# Patient Record
Sex: Male | Born: 1975 | Race: White | Hispanic: No | Smoking: Never smoker
Health system: Southern US, Community
[De-identification: ages and names within clinical notes are randomized; demographics above are authoritative.]

---

## 2016-03-22 ENCOUNTER — Emergency Department (HOSPITAL_COMMUNITY): Payer: Self-pay | Admitting: Anesthesiology

## 2016-03-22 ENCOUNTER — Emergency Department (HOSPITAL_COMMUNITY): Payer: Self-pay

## 2016-03-22 ENCOUNTER — Ambulatory Visit (HOSPITAL_COMMUNITY)
Admission: EM | Admit: 2016-03-22 | Discharge: 2016-03-23 | Disposition: A | Discharge status: Home / Self Care | Payer: Self-pay | Attending: Emergency Medicine | Admitting: Emergency Medicine

## 2016-03-22 ENCOUNTER — Encounter (HOSPITAL_COMMUNITY)
Admission: EM | Disposition: A | Discharge status: Home / Self Care | Payer: Self-pay | Source: Home / Self Care | Attending: Emergency Medicine

## 2016-03-22 ENCOUNTER — Encounter (HOSPITAL_COMMUNITY): Payer: Self-pay | Admitting: Emergency Medicine

## 2016-03-22 DIAGNOSIS — S67193A Crushing injury of left middle finger, initial encounter: Secondary | ICD-10-CM | POA: Insufficient documentation

## 2016-03-22 DIAGNOSIS — Y990 Civilian activity done for income or pay: Secondary | ICD-10-CM | POA: Insufficient documentation

## 2016-03-22 DIAGNOSIS — S6722XA Crushing injury of left hand, initial encounter: Secondary | ICD-10-CM | POA: Insufficient documentation

## 2016-03-22 DIAGNOSIS — S67191A Crushing injury of left index finger, initial encounter: Secondary | ICD-10-CM | POA: Insufficient documentation

## 2016-03-22 DIAGNOSIS — S66521A Laceration of intrinsic muscle, fascia and tendon of left index finger at wrist and hand level, initial encounter: Secondary | ICD-10-CM | POA: Insufficient documentation

## 2016-03-22 DIAGNOSIS — S61213A Laceration without foreign body of left middle finger without damage to nail, initial encounter: Secondary | ICD-10-CM | POA: Insufficient documentation

## 2016-03-22 DIAGNOSIS — Z23 Encounter for immunization: Secondary | ICD-10-CM | POA: Insufficient documentation

## 2016-03-22 DIAGNOSIS — W3189XA Contact with other specified machinery, initial encounter: Secondary | ICD-10-CM | POA: Insufficient documentation

## 2016-03-22 DIAGNOSIS — S61211A Laceration without foreign body of left index finger without damage to nail, initial encounter: Secondary | ICD-10-CM | POA: Insufficient documentation

## 2016-03-22 DIAGNOSIS — S61402A Unspecified open wound of left hand, initial encounter: Secondary | ICD-10-CM

## 2016-03-22 DIAGNOSIS — Y9263 Factory as the place of occurrence of the external cause: Secondary | ICD-10-CM | POA: Insufficient documentation

## 2016-03-22 HISTORY — PX: I & D EXTREMITY: SHX5045

## 2016-03-22 LAB — CBC WITH DIFFERENTIAL/PLATELET
BASOS PCT: 0 %
Basophils Absolute: 0 10*3/uL (ref 0.0–0.1)
EOS ABS: 0.1 10*3/uL (ref 0.0–0.7)
EOS PCT: 1 %
HCT: 41.5 % (ref 39.0–52.0)
HEMOGLOBIN: 13.9 g/dL (ref 13.0–17.0)
LYMPHS ABS: 2.6 10*3/uL (ref 0.7–4.0)
Lymphocytes Relative: 18 %
MCH: 31.2 pg (ref 26.0–34.0)
MCHC: 33.5 g/dL (ref 30.0–36.0)
MCV: 93 fL (ref 78.0–100.0)
MONO ABS: 1 10*3/uL (ref 0.1–1.0)
MONOS PCT: 7 %
Neutro Abs: 10.3 10*3/uL — ABNORMAL HIGH (ref 1.7–7.7)
Neutrophils Relative %: 74 %
Platelets: 236 10*3/uL (ref 150–400)
RBC: 4.46 MIL/uL (ref 4.22–5.81)
RDW: 12.6 % (ref 11.5–15.5)
WBC: 14 10*3/uL — ABNORMAL HIGH (ref 4.0–10.5)

## 2016-03-22 LAB — BASIC METABOLIC PANEL
Anion gap: 9 (ref 5–15)
BUN: 10 mg/dL (ref 6–20)
CALCIUM: 8.4 mg/dL — AB (ref 8.9–10.3)
CHLORIDE: 108 mmol/L (ref 101–111)
CO2: 23 mmol/L (ref 22–32)
CREATININE: 1 mg/dL (ref 0.61–1.24)
GFR calc Af Amer: >60 mL/min (ref >60)
GFR calc non Af Amer: >60 mL/min (ref >60)
Glucose, Bld: 105 mg/dL — ABNORMAL HIGH (ref 65–99)
Potassium: 3.8 mmol/L (ref 3.5–5.1)
SODIUM: 140 mmol/L (ref 135–145)

## 2016-03-22 SURGERY — IRRIGATION AND DEBRIDEMENT EXTREMITY
Anesthesia: General | Site: Arm Lower | Laterality: Left

## 2016-03-22 MED ORDER — CEFAZOLIN SODIUM 1-5 GM-% IV SOLN
1.0000 g | Freq: Once | INTRAVENOUS | Status: AC
  Administered 2016-03-22: 1 g via INTRAVENOUS
  Filled 2016-03-22: qty 50

## 2016-03-22 MED ORDER — LACTATED RINGERS IV SOLN
INTRAVENOUS | Status: DC | PRN
  Administered 2016-03-22 (×2): via INTRAVENOUS

## 2016-03-22 MED ORDER — LIDOCAINE HCL (CARDIAC) 20 MG/ML IV SOLN
INTRAVENOUS | Status: DC | PRN
  Administered 2016-03-22: 75 mg via INTRAVENOUS

## 2016-03-22 MED ORDER — PROPOFOL 10 MG/ML IV BOLUS
INTRAVENOUS | Status: AC
  Filled 2016-03-22: qty 40

## 2016-03-22 MED ORDER — HYDROMORPHONE HCL 1 MG/ML IJ SOLN
INTRAMUSCULAR | Status: AC
  Filled 2016-03-22: qty 1

## 2016-03-22 MED ORDER — ONDANSETRON HCL 4 MG/2ML IJ SOLN
4.0000 mg | Freq: Once | INTRAMUSCULAR | Status: AC
  Administered 2016-03-22: 4 mg via INTRAVENOUS

## 2016-03-22 MED ORDER — BUPIVACAINE HCL (PF) 0.25 % IJ SOLN
INTRAMUSCULAR | Status: AC
  Filled 2016-03-22: qty 30

## 2016-03-22 MED ORDER — SODIUM CHLORIDE 0.9 % IR SOLN
Status: DC | PRN
  Administered 2016-03-22: 3000 mL

## 2016-03-22 MED ORDER — CEFAZOLIN SODIUM 1-5 GM-% IV SOLN
INTRAVENOUS | Status: DC | PRN
  Administered 2016-03-22: 1 g via INTRAVENOUS

## 2016-03-22 MED ORDER — SUFENTANIL CITRATE 50 MCG/ML IV SOLN
INTRAVENOUS | Status: DC | PRN
  Administered 2016-03-22 (×3): 10 ug via INTRAVENOUS
  Administered 2016-03-22: 5 ug via INTRAVENOUS
  Administered 2016-03-22: 10 ug via INTRAVENOUS
  Administered 2016-03-22: 5 ug via INTRAVENOUS
  Administered 2016-03-22 (×2): 10 ug via INTRAVENOUS

## 2016-03-22 MED ORDER — ONDANSETRON HCL 4 MG/2ML IJ SOLN
INTRAMUSCULAR | Status: DC | PRN
  Administered 2016-03-22: 4 mg via INTRAVENOUS

## 2016-03-22 MED ORDER — SUFENTANIL CITRATE 50 MCG/ML IV SOLN
INTRAVENOUS | Status: AC
  Filled 2016-03-22: qty 1

## 2016-03-22 MED ORDER — SODIUM CHLORIDE 0.9 % IJ SOLN
INTRAMUSCULAR | Status: AC
  Filled 2016-03-22: qty 10

## 2016-03-22 MED ORDER — MIDAZOLAM HCL 2 MG/2ML IJ SOLN
INTRAMUSCULAR | Status: AC
  Filled 2016-03-22: qty 2

## 2016-03-22 MED ORDER — TETANUS-DIPHTH-ACELL PERTUSSIS 5-2.5-18.5 LF-MCG/0.5 IM SUSP
INTRAMUSCULAR | Status: AC
  Filled 2016-03-22: qty 0.5

## 2016-03-22 MED ORDER — PROPOFOL 10 MG/ML IV BOLUS
INTRAVENOUS | Status: DC | PRN
  Administered 2016-03-22: 160 mg via INTRAVENOUS

## 2016-03-22 MED ORDER — HYDROMORPHONE HCL 1 MG/ML IJ SOLN
1.0000 mg | Freq: Once | INTRAMUSCULAR | Status: AC
  Administered 2016-03-22: 1 mg via INTRAVENOUS

## 2016-03-22 MED ORDER — TETANUS-DIPHTH-ACELL PERTUSSIS 5-2.5-18.5 LF-MCG/0.5 IM SUSP
0.5000 mL | Freq: Once | INTRAMUSCULAR | Status: AC
  Administered 2016-03-22: 0.5 mL via INTRAMUSCULAR

## 2016-03-22 MED ORDER — ROCURONIUM BROMIDE 50 MG/5ML IV SOLN
INTRAVENOUS | Status: AC
  Filled 2016-03-22: qty 1

## 2016-03-22 MED ORDER — MIDAZOLAM HCL 5 MG/5ML IJ SOLN
INTRAMUSCULAR | Status: DC | PRN
  Administered 2016-03-22: 2 mg via INTRAVENOUS

## 2016-03-22 MED ORDER — HYDROMORPHONE HCL 1 MG/ML IJ SOLN
2.0000 mg | Freq: Once | INTRAMUSCULAR | Status: AC
  Administered 2016-03-22: 2 mg via INTRAVENOUS

## 2016-03-22 MED ORDER — OXYCODONE-ACETAMINOPHEN 5-325 MG PO TABS: ORAL_TABLET | ORAL | Status: AC

## 2016-03-22 MED ORDER — 0.9 % SODIUM CHLORIDE (POUR BTL) OPTIME
TOPICAL | Status: DC | PRN
  Administered 2016-03-22: 1000 mL

## 2016-03-22 MED ORDER — SULFAMETHOXAZOLE-TRIMETHOPRIM 800-160 MG PO TABS: 1.0000 | ORAL_TABLET | Freq: Two times a day (BID) | ORAL | Status: AC

## 2016-03-22 MED ORDER — SUCCINYLCHOLINE CHLORIDE 20 MG/ML IJ SOLN
INTRAMUSCULAR | Status: DC | PRN
  Administered 2016-03-22: 120 mg via INTRAVENOUS

## 2016-03-22 MED ORDER — LIDOCAINE HCL (CARDIAC) 20 MG/ML IV SOLN
INTRAVENOUS | Status: AC
  Filled 2016-03-22: qty 5

## 2016-03-22 MED ORDER — ONDANSETRON HCL 4 MG/2ML IJ SOLN
INTRAMUSCULAR | Status: AC
  Filled 2016-03-22: qty 2

## 2016-03-22 MED ORDER — HYDROMORPHONE HCL 1 MG/ML IJ SOLN
INTRAMUSCULAR | Status: AC
  Filled 2016-03-22: qty 2

## 2016-03-22 MED ORDER — CEFAZOLIN SODIUM 1 G IJ SOLR
INTRAMUSCULAR | Status: AC
  Filled 2016-03-22: qty 10

## 2016-03-22 MED ORDER — BUPIVACAINE HCL (PF) 0.25 % IJ SOLN
INTRAMUSCULAR | Status: DC | PRN
  Administered 2016-03-22: 10 mL

## 2016-03-22 SURGICAL SUPPLY — 66 items
BANDAGE ACE 3X5.8 VEL STRL LF (GAUZE/BANDAGES/DRESSINGS) ×4 IMPLANT
BANDAGE ELASTIC 3 VELCRO ST LF (GAUZE/BANDAGES/DRESSINGS) ×4 IMPLANT
BANDAGE ELASTIC 4 VELCRO ST LF (GAUZE/BANDAGES/DRESSINGS) ×4 IMPLANT
BNDG ESMARK 4X9 LF (GAUZE/BANDAGES/DRESSINGS) ×4 IMPLANT
BNDG GAUZE ELAST 4 BULKY (GAUZE/BANDAGES/DRESSINGS) ×4 IMPLANT
CHLORAPREP W/TINT 26ML (MISCELLANEOUS) IMPLANT
CORDS BIPOLAR (ELECTRODE) ×4 IMPLANT
COVER MAYO STAND STRL (DRAPES) IMPLANT
COVER SURGICAL LIGHT HANDLE (MISCELLANEOUS) ×4 IMPLANT
COVER TABLE BACK 60X90 (DRAPES) IMPLANT
CUFF TOURNIQUET SINGLE 18IN (TOURNIQUET CUFF) ×4 IMPLANT
DRAIN PENROSE 1/4X12 LTX STRL (WOUND CARE) IMPLANT
DRAPE EXTREMITY T 121X128X90 (DRAPE) IMPLANT
DRAPE SURG 17X23 STRL (DRAPES) IMPLANT
DRSG ADAPTIC 3X8 NADH LF (GAUZE/BANDAGES/DRESSINGS) IMPLANT
DRSG EMULSION OIL 3X3 NADH (GAUZE/BANDAGES/DRESSINGS) ×4 IMPLANT
GAUZE SPONGE 4X4 12PLY STRL (GAUZE/BANDAGES/DRESSINGS) ×4 IMPLANT
GAUZE XEROFORM 1X8 LF (GAUZE/BANDAGES/DRESSINGS) ×4 IMPLANT
GLOVE BIO SURGEON STRL SZ7.5 (GLOVE) ×8 IMPLANT
GLOVE BIOGEL PI IND STRL 8 (GLOVE) ×4 IMPLANT
GLOVE BIOGEL PI INDICATOR 8 (GLOVE) ×4
GOWN STRL REUS W/ TWL LRG LVL3 (GOWN DISPOSABLE) ×2 IMPLANT
GOWN STRL REUS W/ TWL XL LVL3 (GOWN DISPOSABLE) ×2 IMPLANT
GOWN STRL REUS W/TWL LRG LVL3 (GOWN DISPOSABLE) ×2
GOWN STRL REUS W/TWL XL LVL3 (GOWN DISPOSABLE) ×2
KIT BASIN OR (CUSTOM PROCEDURE TRAY) ×4 IMPLANT
KIT ROOM TURNOVER OR (KITS) ×4 IMPLANT
MANIFOLD NEPTUNE II (INSTRUMENTS) ×4 IMPLANT
NDL SAFETY ECLIPSE 18X1.5 (NEEDLE) IMPLANT
NEEDLE HYPO 18GX1.5 SHARP (NEEDLE)
NEEDLE HYPO 25X1 1.5 SAFETY (NEEDLE) IMPLANT
NS IRRIG 1000ML POUR BTL (IV SOLUTION) ×4 IMPLANT
PACK ORTHO EXTREMITY (CUSTOM PROCEDURE TRAY) ×4 IMPLANT
PAD ARMBOARD 7.5X6 YLW CONV (MISCELLANEOUS) ×4 IMPLANT
PAD CAST 3X4 CTTN HI CHSV (CAST SUPPLIES) ×2 IMPLANT
PAD CAST 4YDX4 CTTN HI CHSV (CAST SUPPLIES) ×2 IMPLANT
PADDING CAST ABS 4INX4YD NS (CAST SUPPLIES) ×2
PADDING CAST ABS COTTON 4X4 ST (CAST SUPPLIES) ×2 IMPLANT
PADDING CAST COTTON 3X4 STRL (CAST SUPPLIES) ×2
PADDING CAST COTTON 4X4 STRL (CAST SUPPLIES) ×2
SCRUB BETADINE 4OZ XXX (MISCELLANEOUS) ×4 IMPLANT
SOLUTION BETADINE 4OZ (MISCELLANEOUS) ×4 IMPLANT
SPLINT PLASTER CAST XFAST 3X15 (CAST SUPPLIES) ×2 IMPLANT
SPLINT PLASTER EXTRA FAST 3X15 (CAST SUPPLIES) ×2
SPLINT PLASTER GYPS XFAST 3X15 (CAST SUPPLIES) ×2 IMPLANT
SPLINT PLASTER XTRA FASTSET 3X (CAST SUPPLIES) ×2
SPONGE LAP 4X18 X RAY DECT (DISPOSABLE) IMPLANT
STOCKINETTE 4X48 STRL (DRAPES) IMPLANT
SUT ETHIBOND 3-0 V-5 (SUTURE) IMPLANT
SUT ETHILON 3 0 PS 1 (SUTURE) ×16 IMPLANT
SUT ETHILON 4 0 P 3 18 (SUTURE) IMPLANT
SUT ETHILON 4 0 PS 2 18 (SUTURE) ×12 IMPLANT
SUT FIBERWIRE 4-0 18 TAPR NDL (SUTURE)
SUT MERSILENE 6 0 P 1 (SUTURE) IMPLANT
SUT MON AB 5-0 P3 18 (SUTURE) IMPLANT
SUT NYLON 9 0 VRM6 (SUTURE) IMPLANT
SUT PROLENE 6 0 P 1 18 (SUTURE) IMPLANT
SUT SILK 4 0 PS 2 (SUTURE) IMPLANT
SUT VICRYL 4-0 PS2 18IN ABS (SUTURE) IMPLANT
SUTURE FIBERWR 4-0 18 TAPR NDL (SUTURE) IMPLANT
SYR CONTROL 10ML LL (SYRINGE) IMPLANT
TOWEL OR 17X24 6PK STRL BLUE (TOWEL DISPOSABLE) ×8 IMPLANT
TOWEL OR 17X26 10 PK STRL BLUE (TOWEL DISPOSABLE) ×4 IMPLANT
TUBING CYSTO DISP (UROLOGICAL SUPPLIES) ×4 IMPLANT
UNDERPAD 30X30 INCONTINENT (UNDERPADS AND DIAPERS) ×4 IMPLANT
YANKAUER SUCT BULB TIP NO VENT (SUCTIONS) ×4 IMPLANT

## 2016-03-22 NOTE — Anesthesia Preprocedure Evaluation (Addendum)
Anesthesia Evaluation   Patient awake    Reviewed: Allergy & Precautions, NPO status , Patient's Chart, lab work & pertinent test results  History of Anesthesia Complications Negative for: history of anesthetic complications  Airway Mallampati: II   Neck ROM: Full    Dental  (+) Teeth Intact, Dental Advisory Given   Pulmonary Current Smoker,  Smokes marijuana daily   breath sounds clear to auscultation       Cardiovascular negative cardio ROS   Rhythm:Regular Rate:Normal     Neuro/Psych negative neurological ROS  negative psych ROS   GI/Hepatic negative GI ROS, (+)     substance abuse  alcohol use and marijuana use,   Endo/Other  negative endocrine ROS  Renal/GU Renal disease     Musculoskeletal negative musculoskeletal ROS (+)   Abdominal   Peds  Hematology negative hematology ROS (+)   Anesthesia Other Findings   Reproductive/Obstetrics                           Anesthesia Physical Anesthesia Plan  ASA: II and emergent  Anesthesia Plan: General   Post-op Pain Management:    Induction: Intravenous  Airway Management Planned: Oral ETT  Additional Equipment:   Intra-op Plan:   Post-operative Plan: Extubation in OR  Informed Consent:   Dental advisory given  Plan Discussed with: CRNA, Anesthesiologist and Surgeon  Anesthesia Plan Comments:         Anesthesia Quick Evaluation

## 2016-03-22 NOTE — Anesthesia Postprocedure Evaluation (Signed)
Anesthesia Post Note  Patient: Edward Stewart  Procedure(s) Performed: Procedure(s) (LRB): EXPLORATION OF  PALMAR/DORSAL WOUND, REPAIR INTRINSIC MUSCLE LEFT INDEX FINGER. (Left)  Patient location during evaluation: PACU Anesthesia Type: General Level of consciousness: awake Vital Signs Assessment: post-procedure vital signs reviewed and stable Respiratory status: spontaneous breathing Cardiovascular status: stable    Last Vitals:  Filed Vitals:   03/22/16 1935 03/22/16 2112  BP: 146/104 147/84  Pulse: 75 72  Temp: 36.9 C   Resp: 26 17    Last Pain:  Filed Vitals:   03/22/16 2113  PainSc: 10-Worst pain ever                 EDWARDS,Carl Butner

## 2016-03-22 NOTE — Discharge Instructions (Signed)

## 2016-03-22 NOTE — Brief Op Note (Signed)
03/22/2016  11:52 PM  PATIENT:  Renie OraMatthew Dowding  40 y.o. male  PRE-OPERATIVE DIAGNOSIS:  LEFT HAND LACERATIONS AND PARTIAL DEGLOVING  POST-OPERATIVE DIAGNOSIS:  LEFT HAND LACERATIONS AND PARTIAL DEGLOVING  PROCEDURE:  Procedure(s): EXPLORATION OF  PALMAR/DORSAL WOUND, REPAIR INTRINSIC MUSCLE LEFT INDEX FINGER. (Left)  SURGEON:  Surgeon(s) and Role:    * Betha LoaKevin Mehdi Gironda, MD - Primary  PHYSICIAN ASSISTANT:   ASSISTANTS: none   ANESTHESIA:   general  EBL:  Total I/O In: 1500 [I.V.:1500] Out: 75 [Blood:75]  BLOOD ADMINISTERED:none  DRAINS: none   LOCAL MEDICATIONS USED:  MARCAINE     SPECIMEN:  No Specimen  DISPOSITION OF SPECIMEN:  N/A  COUNTS:  YES  TOURNIQUET:  * Missing tourniquet times found for documented tourniquets in log:  [id number] *  DICTATION: .Other Dictation: Dictation Number [id number]  PLAN OF CARE: Discharge to home after PACU  PATIENT DISPOSITION:  PACU - hemodynamically stable.

## 2016-03-22 NOTE — ED Provider Notes (Signed)
CSN: [id number]     Arrival date & time 03/22/16  1913 History   First MD Initiated Contact with Patient 03/22/16 1917     Chief Complaint  Patient presents with  . Hand Injury     (Consider location/radiation/quality/duration/timing/severity/associated sxs/prior Treatment) HPI patient is otherwise healthy male who presents following a hand injury. 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 His left hand became trapped, and when he pulled it out he suffered a degloving of the dorsal tissue of his hand. He is complaining of severe pain, and received 175 g of fentanyl from EMS prior to arrival. He is not up-to-date on his tetanus. He has been able to move his fingers, and denies any numbness in his hand.    History reviewed. No pertinent past medical history. History reviewed. No pertinent past surgical history. History reviewed. No pertinent family history. Social History  Substance Use Topics  . Smoking status: Never Smoker   . Smokeless tobacco: None  . Alcohol Use: No    Review of Systems  Musculoskeletal: Positive for arthralgias.  Skin: Positive for wound.  All other systems reviewed and are negative.     Allergies  Review of patient's allergies indicates no known allergies.  Home Medications   Prior to Admission medications   Medication Sig Start Date End Date Taking? Authorizing Provider  oxyCODONE-acetaminophen (PERCOCET) 5-325 MG tablet 1-2 tabs po q6 hours prn pain 03/22/16   Betha LoaKevin Kuzma, MD  sulfamethoxazole-trimethoprim (BACTRIM DS) 800-160 MG tablet Take 1 tablet by mouth 2 (two) times daily. 03/22/16   Betha LoaKevin Kuzma, MD   BP 127/88 mmHg  Pulse 87  Temp(Src) 97.7 F (36.5 C) (Oral)  Resp 13  SpO2 93% Physical Exam  Constitutional: He is oriented to person, place, and time. He appears well-developed and well-nourished.  HENT:  Head: Normocephalic and atraumatic.  Eyes: Pupils are equal,  round, and reactive to light.  Neck: Normal range of motion. No JVD present.  Cardiovascular: Normal rate.   Pulmonary/Chest: Effort normal and breath sounds normal.  Abdominal: Soft. Bowel sounds are normal. He exhibits distension.  Musculoskeletal:  Extensive degloving injury to the volar surface of the left hand. Visible retinaculum, extensor tendons. Able to flex and extend all fingers against resistance. Fingers appear well perfused. Mild sensory deficits over the volar skin of the left hand, but sensation and preserved in fingertips.  Neurological: He is alert and oriented to person, place, and time.  Skin: Skin is warm and dry.  Nursing note and vitals reviewed.   ED Course  Procedures (including critical care time) Labs Review Labs Reviewed  CBC WITH DIFFERENTIAL/PLATELET - Abnormal; Notable for the following:    WBC 14.0 (*)    Neutro Abs 10.3 (*)    All other components within normal limits  BASIC METABOLIC PANEL - Abnormal; Notable for the following:    Glucose, Bld 105 (*)    Calcium 8.4 (*)    All other components within normal limits    Imaging Review Dg Hand Complete Left  03/22/2016  CLINICAL DATA:  Left hand caught in conveyer belt, with crush injury to fingers. Initial encounter. EXAM: LEFT HAND - COMPLETE 3+ VIEW COMPARISON:  None. FINDINGS: There is no evidence of fracture or dislocation. The soft tissues are difficult to fully assess due to the overlying dressing, though there appears to be scattered soft tissue air about the hand, with diffuse soft tissue  swelling. Visualized joint spaces are grossly preserved. IMPRESSION: 1. No evidence of fracture or dislocation. 2. Scattered soft tissue air about the hand, with diffuse soft tissue swelling. Electronically Signed   By: Roanna Raider M.D.   On: 03/22/2016 20:12   I have personally reviewed and evaluated these images and lab results as part of my medical decision-making.   EKG Interpretation None      MDM    Final diagnoses:  Degloving injury of left hand, initial encounter    Patient presents with degloving injury to his left hand. X-rays obtained and do not show any evidence of fracture. He does have mild paresthesias over the skin where it appears his cutaneous nerves been disrupted, however grossly he has intact motor function in his hand. He was given IV antibiotics initially upon his arrival and his pain was controlled. Consultation was obtained from the hand surgeon who plans for emergent operative management.    Erskine Emery, MD 03/23/16 1610  Richardean Canal, MD 03/25/16 [phone]

## 2016-03-22 NOTE — Anesthesia Procedure Notes (Signed)
Procedure Name: Intubation Date/Time: 03/22/2016 9:54 PM Performed by: Nicholos JohnsMCPHAIL, Jaysa Kise S Pre-anesthesia Checklist: Patient identified, Emergency Drugs available, Suction available, Patient being monitored and Timeout performed Patient Re-evaluated:Patient Re-evaluated prior to inductionOxygen Delivery Method: Circle system utilized Preoxygenation: Pre-oxygenation with 100% oxygen Intubation Type: IV induction, Rapid sequence and Cricoid Pressure applied Laryngoscope Size: Mac and 4 Grade View: Grade I Tube type: Oral Tube size: 8.0 mm Number of attempts: 1 Airway Equipment and Method: Bougie stylet Placement Confirmation: ETT inserted through vocal cords under direct vision,  positive ETCO2 and breath sounds checked- equal and bilateral Secured at: 23 cm Tube secured with: Tape Dental Injury: Teeth and Oropharynx as per pre-operative assessment

## 2016-03-22 NOTE — ED Notes (Signed)
Soaked two abdominal pads with sterile saline 0.9% and wrapped hand, also used two dry abdominal pads to cover soaked pads, wrapped entire hand with 4 inch gauze fluff roll, per MD.

## 2016-03-22 NOTE — H&P (Signed)
Edward Stewart is an 40 y.o. male.   Chief Complaint: left hand lacerations/degloving HPI: 40 year old right-hand dominant male states he was at work when he reached onto a conveyer belt which caught his left hand pulling it into the belt. He sustained lacerations and wounds to both the volar and dorsal aspects of the hand. He was brought to the [facility] emergency department where he was evaluated. I was consulted for management of the injury. He reports no previous injuries to the hand and no other injuries at this time. He reports the injury happened in approximate 6:00. He is complaining of significant pain and requesting pain medication. He notes no numbness in the hand. No fevers chills or night sweats.  Case discussed with Dr. Darl Householder and the note from Leata Mouse, MD on 03/22/16 was reviewed. Xrays viewed and interpreted by me: AP, lateral, oblique views of left hand show no fractures, dislocations, radioopaque foreign bodies. Labs reviewed: WBC 14.0  Allergies: No Known Allergies  History reviewed. No pertinent past medical history.  History reviewed. No pertinent past surgical history.  Family History: History reviewed. No pertinent family history.  Social History:   reports that he has never smoked. He does not have any smokeless tobacco history on file. He reports that he does not drink alcohol or use illicit drugs.  Medications:  (Not in a hospital admission)  Results for orders placed or performed during the hospital encounter of 03/22/16 (from the past 48 hour(s))  CBC with Differential     Status: Abnormal   Collection Time: 03/22/16  7:45 PM  Result Value Ref Range   WBC 14.0 (H) 4.0 - 10.5 K/uL   RBC 4.46 4.22 - 5.81 MIL/uL   Hemoglobin 13.9 13.0 - 17.0 g/dL   HCT 41.5 39.0 - 52.0 %   MCV 93.0 78.0 - 100.0 fL   MCH 31.2 26.0 - 34.0 pg   MCHC 33.5 30.0 - 36.0 g/dL   RDW 12.6 11.5 - 15.5 %   Platelets 236 150 - 400 K/uL   Neutrophils Relative % 74 %   Neutro Abs 10.3  (H) 1.7 - 7.7 K/uL   Lymphocytes Relative 18 %   Lymphs Abs 2.6 0.7 - 4.0 K/uL   Monocytes Relative 7 %   Monocytes Absolute 1.0 0.1 - 1.0 K/uL   Eosinophils Relative 1 %   Eosinophils Absolute 0.1 0.0 - 0.7 K/uL   Basophils Relative 0 %   Basophils Absolute 0.0 0.0 - 0.1 K/uL  Basic metabolic panel     Status: Abnormal   Collection Time: 03/22/16  7:45 PM  Result Value Ref Range   Sodium 140 135 - 145 mmol/L   Potassium 3.8 3.5 - 5.1 mmol/L   Chloride 108 101 - 111 mmol/L   CO2 23 22 - 32 mmol/L   Glucose, Bld 105 (H) 65 - 99 mg/dL   BUN 10 6 - 20 mg/dL   Creatinine, Ser 1.00 0.61 - 1.24 mg/dL   Calcium 8.4 (L) 8.9 - 10.3 mg/dL   GFR calc non Af Amer >60 >60 mL/min   GFR calc Af Amer >60 >60 mL/min    Comment: (NOTE) The eGFR has been calculated using the CKD EPI equation. This calculation has not been validated in all clinical situations. eGFR's persistently <60 mL/min signify possible Chronic Kidney Disease.    Anion gap 9 5 - 15    Dg Hand Complete Left  03/22/2016  CLINICAL DATA:  Left hand caught in conveyer belt, with  crush injury to fingers. Initial encounter. EXAM: LEFT HAND - COMPLETE 3+ VIEW COMPARISON:  None. FINDINGS: There is no evidence of fracture or dislocation. The soft tissues are difficult to fully assess due to the overlying dressing, though there appears to be scattered soft tissue air about the hand, with diffuse soft tissue swelling. Visualized joint spaces are grossly preserved. IMPRESSION: 1. No evidence of fracture or dislocation. 2. Scattered soft tissue air about the hand, with diffuse soft tissue swelling. Electronically Signed   By: Garald Balding M.D.   On: 03/22/2016 20:12     A comprehensive review of systems was negative. Review of Systems: No fevers, chills, night sweats, chest pain, shortness of breath, nausea, vomiting, diarrhea, constipation, easy bleeding or bruising, headaches, dizziness, vision changes, fainting.   Blood pressure  146/104, pulse 75, temperature 98.5 F (36.9 C), temperature source Oral, resp. rate 26, SpO2 95 %.  General appearance: alert, cooperative, appears stated age and moderate distress Head: Normocephalic, without obvious abnormality, atraumatic Neck: supple, symmetrical, trachea midline Resp: clear to auscultation bilaterally Cardio: regular rate and rhythm GI: non-tender Extremities: right ue: no wounds or ttp.  Intact epl/fpl/io.  Intact sensation and capillary refill all digits.  Left UE: intact sensation and capillary refill all digits, though significant blood on fingertips.  States fingers all feel normal.  +epl/fpl/io.  Intact fdp/fds to digits.  Able to extend limited by pain.  Dorsoulnar laceration with partial distal degloving and wound in thumb index webspace volarly.  Decreased sensation on dorsoulnar side of hand but intact dorsum of ring/small finger.  Intact sensation dorsoradial hand and digits. Pulses: 2+ and symmetric Skin: Skin color, texture, turgor normal. No rashes or lesions or except as above Neurologic: Grossly normal Incision/Wound: As above  Assessment/Plan Left hand lacerations and partial degloving.  Recommend OR for irrigation and debridement and repair of tendon/arter/nerve as necessary.  Risks, benefits, and alternatives of surgery were discussed and the patient agrees with the plan of care.   Love Chowning R 03/22/2016, 9:13 PM

## 2016-03-22 NOTE — ED Notes (Signed)
Patient at work and saw a piece of plastic on conveyor belt, put hand in to retreive the plastic, got his hand caught in belt. He jerked his hand out, degloved the top of left hand.  Tendons and bone exposed.  Patient also has laceration to inner hand.  CSMT's and pulses intact.  Patient is CAOx4.

## 2016-03-22 NOTE — Op Note (Signed)
[id number] 

## 2016-03-23 ENCOUNTER — Encounter (HOSPITAL_COMMUNITY): Payer: Self-pay | Admitting: Orthopedic Surgery

## 2016-03-23 NOTE — Op Note (Addendum)
NAMESALLY, REIMERS NO.:  [id number]  MEDICAL RECORD NO.:  [id number]  LOCATION:  [facility]                         FACILITY:  [facility]  PHYSICIAN:  Betha Loa, MD        DATE OF BIRTH:  1976-10-19  DATE OF PROCEDURE:  03/22/2016 DATE OF DISCHARGE:                              OPERATIVE REPORT   PREOPERATIVE DIAGNOSES:  Left hand lacerations and partial degloving.  POSTOPERATIVE DIAGNOSES:  Left hand volar and dorsal lacerations and long finger laceration with dorsal being a partial degloving and intrinsic muscle to index finger laceration.  PROCEDURE:   1. Left hand exploration of 10 cm volar wound with repair of index finger intrinsic muscle 2. Left hand exploration of 18 cm dorsal degloving injury and repair of complex wound 3. Exploration and repair of left long finger 2 cm intermediate traumatic wound.  SURGEON:  Betha Loa, MD.  ASSISTANT:  None.  ANESTHESIA:  General.  IV FLUIDS:  Per anesthesia flow sheet.  ESTIMATED BLOOD LOSS:  Minimal.  COMPLICATIONS:  None.  SPECIMENS:  None.  TOURNIQUET TIME:  Approximately 60 minutes.  DISPOSITION:  Stable to PACU.  INDICATIONS:  Mr. Saadeh is a 40 year old right-hand-dominant male, who states while at work he reached up to get something off a conveyor belt when his hand was caught in the belt.  He sustained lacerations to the volar and dorsal sides of the hand.  He was brought to the emergency department where he was evaluated.  Radiographs were taken revealing no fractures.  I recommended operative exploration of the wound with repair of tendon, artery, and nerve as necessary.  Risks, benefits, and alternatives of surgery were discussed including risk of blood loss; infection; damage to nerves, vessels, tendons, ligaments, bone; failure of surgery; need for additional surgery; complications with wound healing; continued pain.  He voiced understanding of these risks and elected to  proceed.  OPERATIVE COURSE:  After being identified preoperatively by myself, the patient and I agreed upon procedure and site of procedure.  Surgical site was marked.  The risks, benefits, and alternatives of surgery were reviewed and wished to proceed.  Surgical consent had been signed.  He was given IV Ancef as preoperative antibiotic prophylaxis.  His tetanus had been updated by the Emergency Department.  He was transferred to the operating room and placed on the operating table in supine position with left upper extremity on arm board.  General anesthesia was induced by Anesthesiology.  Left upper extremity was prepped and draped in normal sterile orthopedic fashion.  Surgical pause was performed between surgeons, Anesthesia, and operating staff, and all were in agreement as to the patient, procedure, and site of procedure.  The wounds were explored.  The wound of the long finger was explored.  It was less than 2 cm in length.  The digital nerve and artery were noted to be intact.  The flexor sheath was intact.  The wound was repaired after irrigation and debridement.  There was a small pulsatile bleeding just under the skin of the palmar wound, which was treated with bipolar electrocautery.  The wound was 10 cm in length.  The palmar arch was identified.  It was an incomplete arch.  The arch terminated in the common digital artery to the index and long finger. The radial side of the index finger appeared to be supplied by a diminutive branch from the princeps pollicis.  There was an arterial branch going from the arch up toward the thumb in the more proximal portion of the laceration.  The radial digital nerve to the index finger was intact.  The common digital nerve to the index and long finger was identified and was intact.  There was some tearing of the palmar fascia which was excised.  There was a tear to the adductor muscle to the index finger and the thumb index web space.  This  was later repaired with a 2- 0 Vicryl suture in a figure-of-eight fashion.  This was reapproximated the muscle ends well.  The dorsal wound was explored.  This was a degloving type injury with open wound across the entire dorsum of the hand.  It was 18 cm in length.  There was a significant stretch to the dorsal branch of the ulnar nerve, but the nerve was intact.  There was some torn superficial veins and some of that remained intact.  All extensor tendons appeared to be intact.  The wounds were all copiously irrigated with sterile saline by cysto tubing. 3000 mL was used.  The wounds were then closed with 3-0 nylon in the proximal and dorsal surface and 4-0 nylon in the palm.  The index finger intrinsic muscle had been repaired with the 2-0 Vicryl suture.  Good approximation of wounds was obtained.  The tourniquet had been inflated part way through the case after exsanguination of the limb with Esmarch bandage.  It was deflated at the end of the case at approximately 60 minutes.  All fingertips were pink with brisk capillary refill after deflation of the tourniquet.  The wounds were dressed with sterile Xeroform, 4x4s, and wrapped with Kerlix.  A thumb spica splint and resting hand splint was placed and wrapped with Kerlix and Ace bandage. Tourniquet had been deflated.  The operative drapes were broken down. The patient was awoken from anesthesia safely.  He was transferred back to the stretcher and taken to PACU in stable condition.  I will see him back in the office in 1 week for postoperative followup.  I gave him Percocet 5/325, 1-2 p.o. q.6 hours p.r.n. pain, dispensed #30.  We also gave him Bactrim DS 1 p.o. b.i.d. x7 days.     Betha LoaKevin Acen Craun, MD     KK/MEDQ  D:  03/22/2016  T:  03/23/2016  Job:  [id number]  Addendum (04/02/16): Added repair of left long finger wound.  Added wound lengths and description to body of note.

## 2016-03-23 NOTE — Transfer of Care (Signed)
Immediate Anesthesia Transfer of Care Note  Patient: Edward Stewart  Procedure(s) Performed: Procedure(s): EXPLORATION OF  PALMAR/DORSAL WOUND, REPAIR INTRINSIC MUSCLE LEFT INDEX FINGER. (Left)  Patient Location: PACU  Anesthesia Type:General  Level of Consciousness: awake, alert  and oriented  Airway & Oxygen Therapy: Patient Spontanous Breathing and Patient connected to face mask oxygen  Post-op Assessment: Report given to RN and Post -op Vital signs reviewed and stable  Post vital signs: Reviewed and stable  Last Vitals:  Filed Vitals:   03/22/16 2112 03/22/16 2358  BP: 147/84 134/89  Pulse: 72 105  Temp:    Resp: 17 19    Complications: No apparent anesthesia complications

## 2016-12-22 IMAGING — CR DG HAND COMPLETE 3+V*L*
5 series · 5 of 5 positions shown · non-contrast
Comparison: None.

CLINICAL DATA: Left hand caught in conveyer belt, with crush injury
to fingers. Initial encounter.

EXAM:
LEFT HAND - COMPLETE 3+ VIEW

[hand pa (1 of 2)]
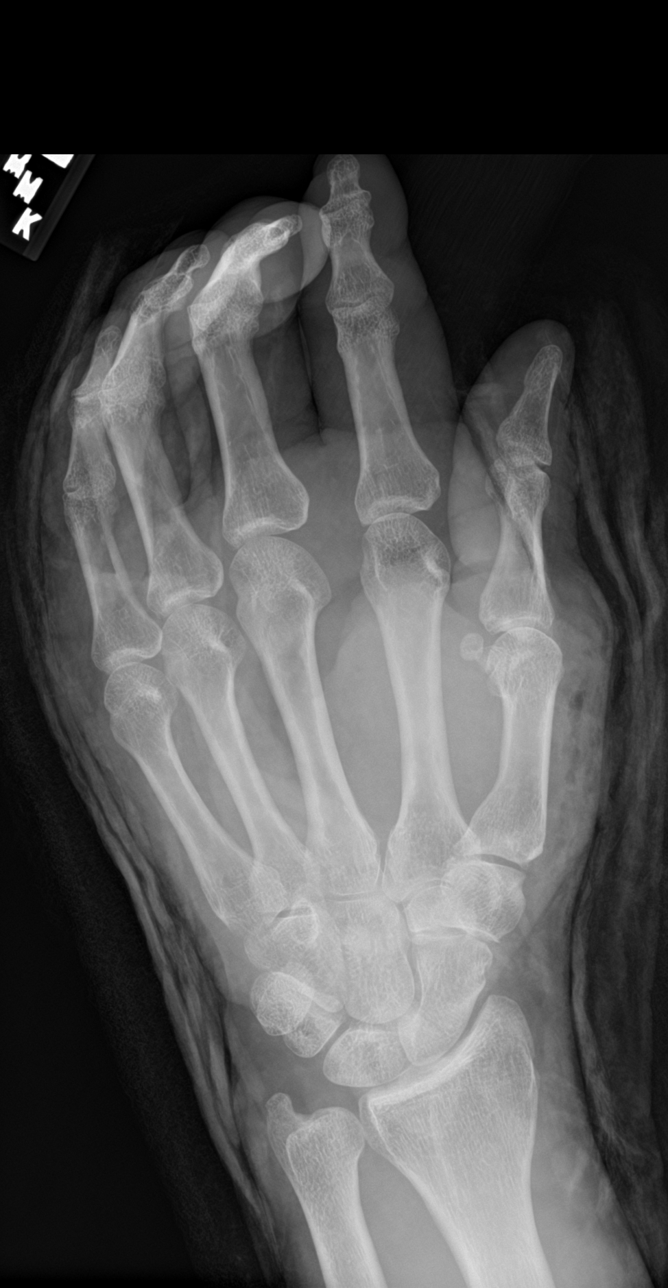

[hand obl (1 of 2)]
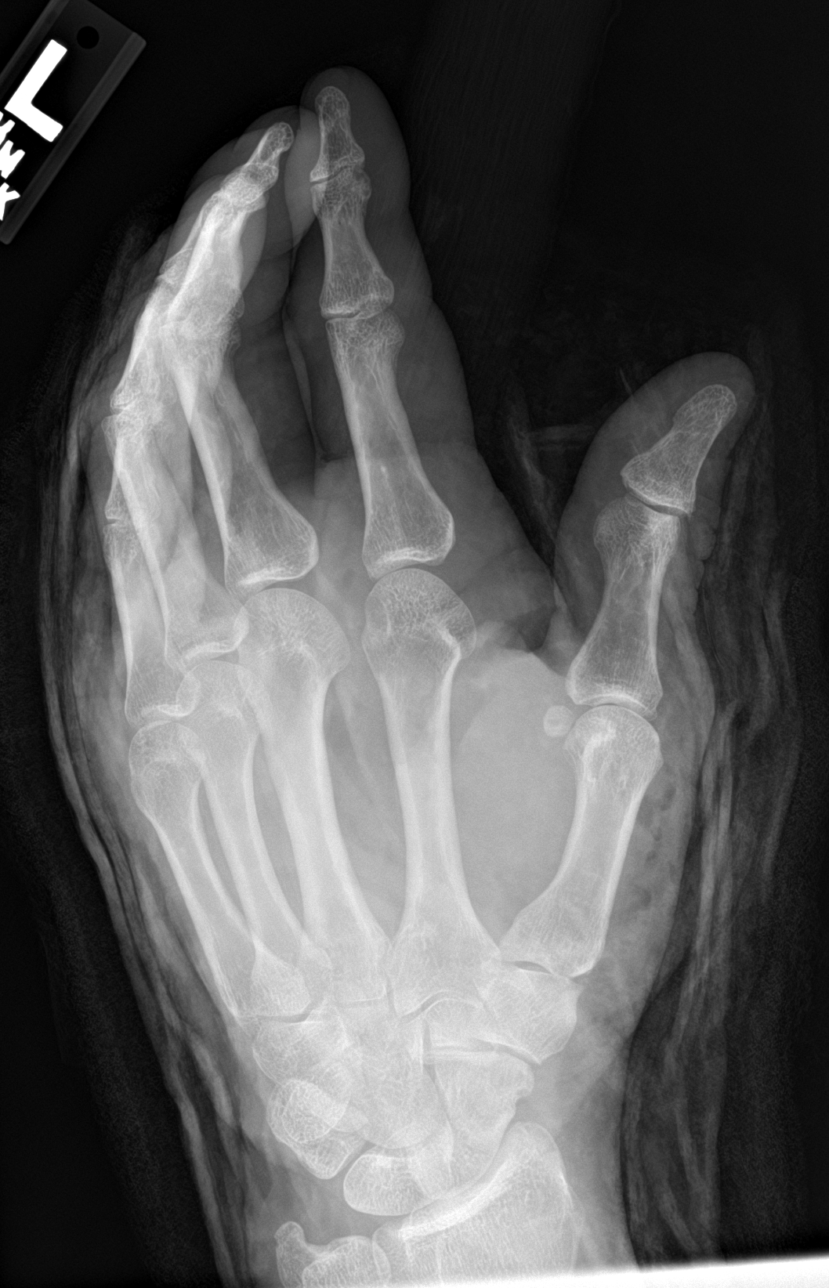

[hand lat]
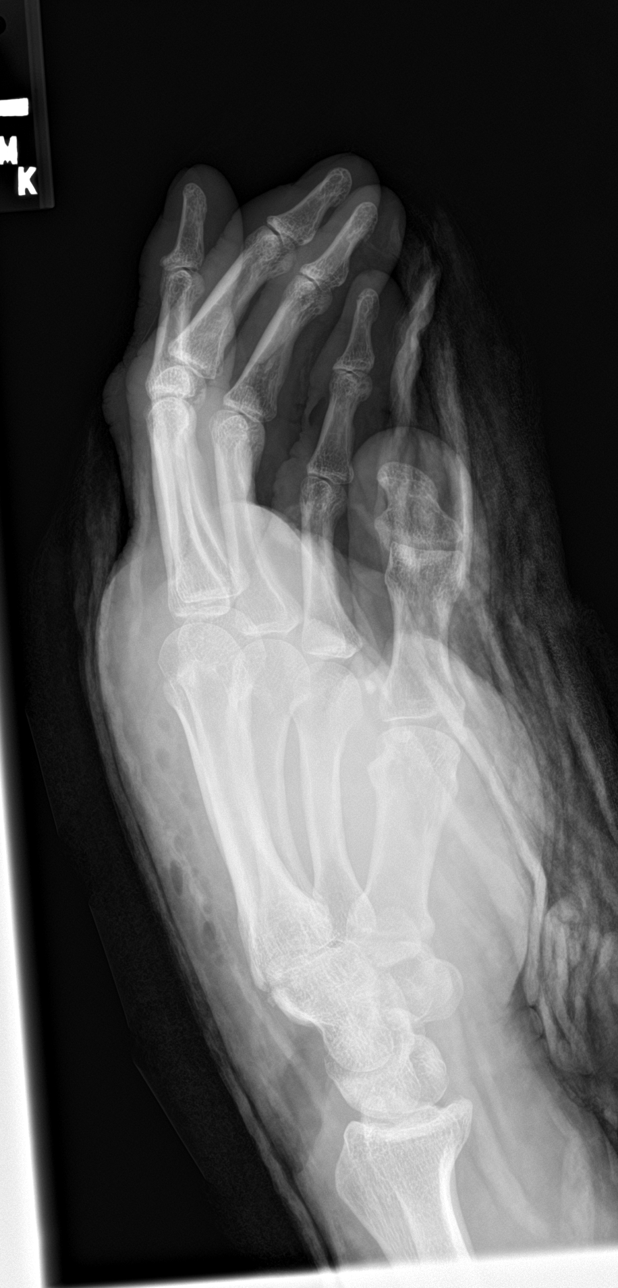

[hand pa (2 of 2)]
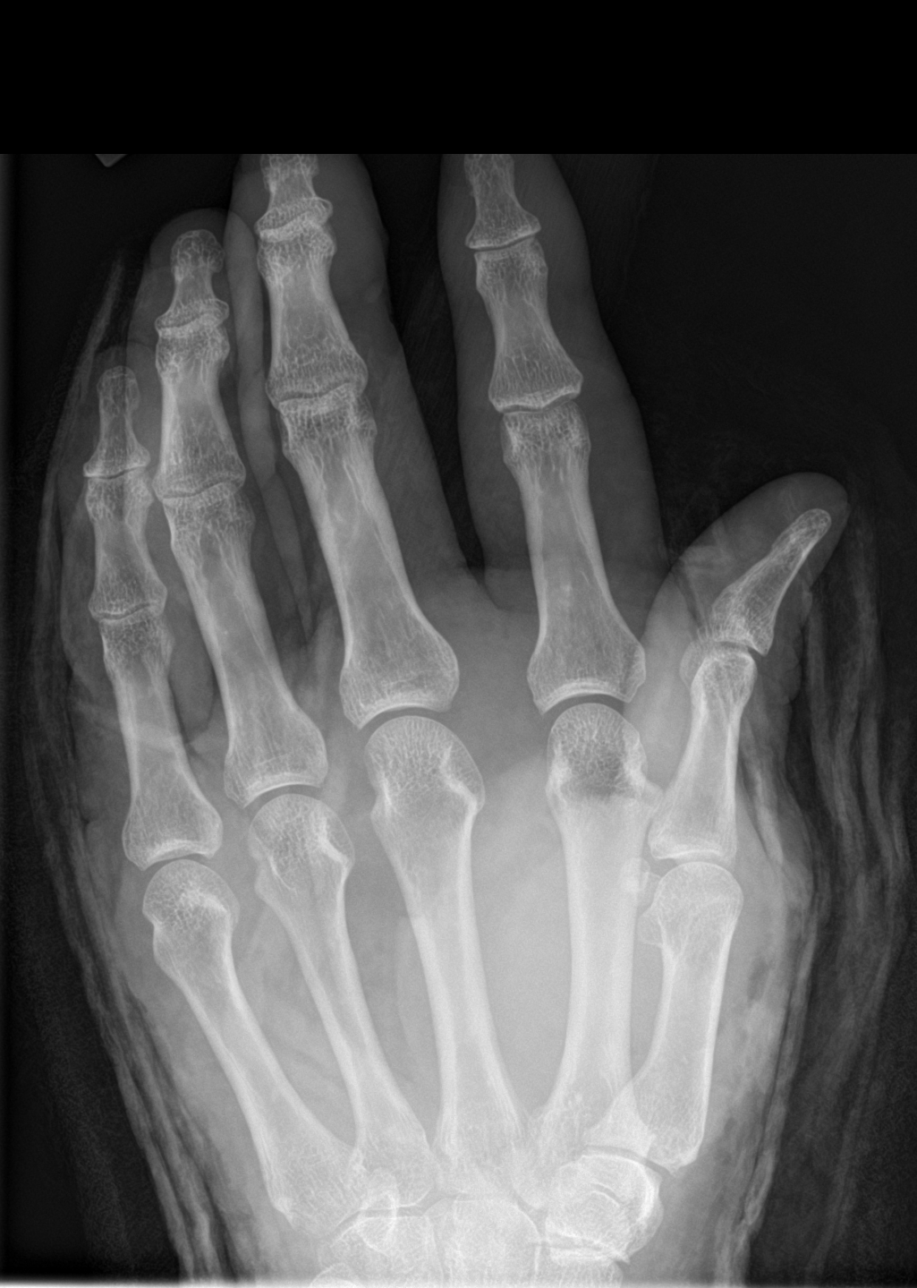

[hand obl (2 of 2)]
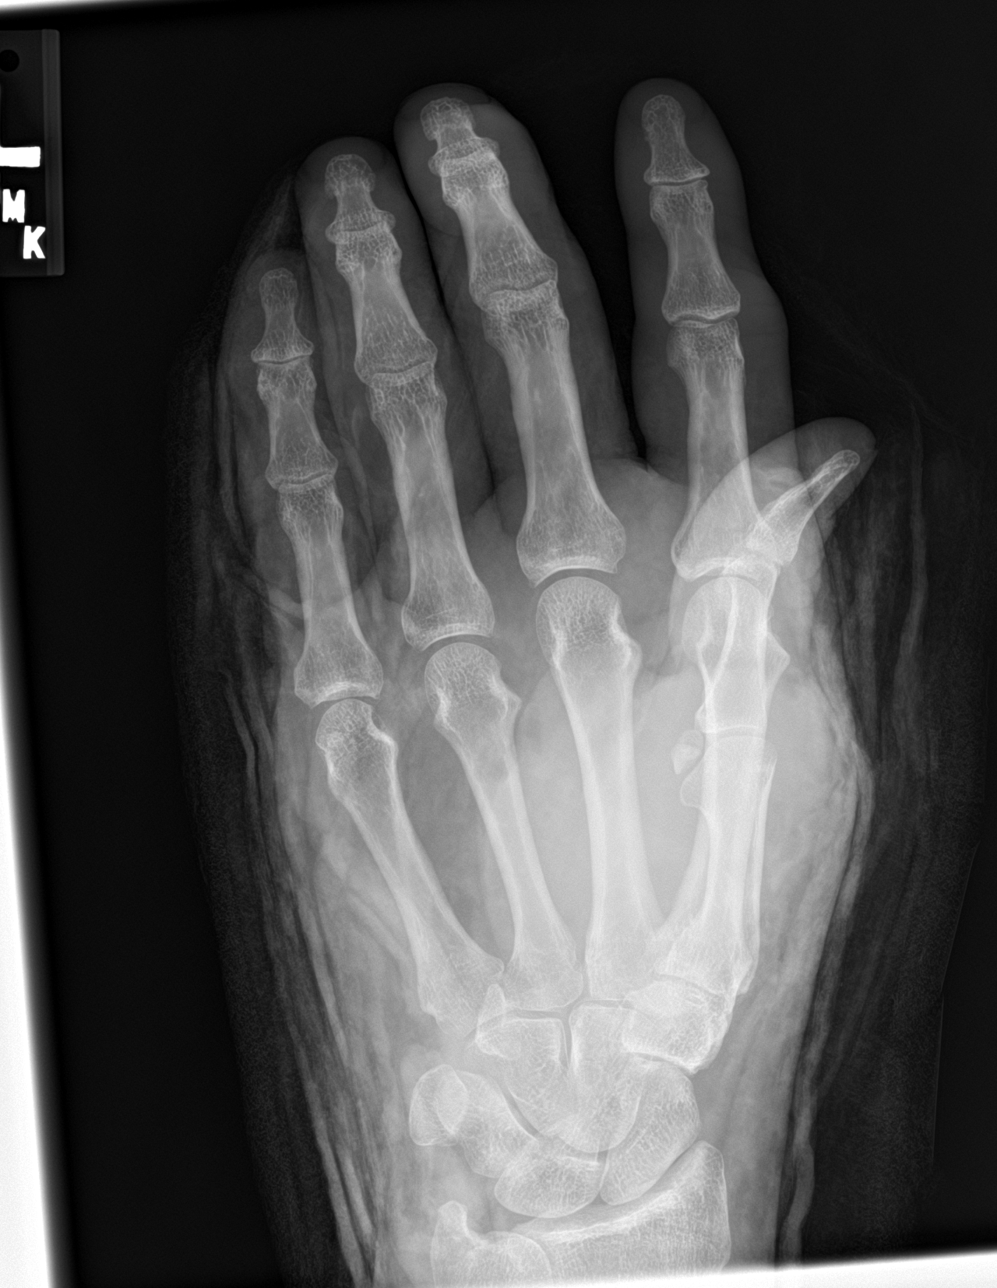

[5 of 5 positions shown; findings below may reference images not displayed]

FINDINGS: There is no evidence of fracture or dislocation. The soft tissues
are difficult to fully assess due to the overlying dressing, though
there appears to be scattered soft tissue air about the hand, with
diffuse soft tissue swelling. Visualized joint spaces are grossly
preserved.
IMPRESSION: 1. No evidence of fracture or dislocation.
2. Scattered soft tissue air about the hand, with diffuse soft
tissue swelling.

## 2021-04-03 ENCOUNTER — Ambulatory Visit (INDEPENDENT_AMBULATORY_CARE_PROVIDER_SITE_OTHER): Payer: Self-pay | Admitting: Nurse Practitioner

## 2021-04-03 ENCOUNTER — Other Ambulatory Visit: Payer: Self-pay

## 2021-04-03 ENCOUNTER — Encounter: Payer: Self-pay | Admitting: Nurse Practitioner

## 2021-04-03 VITALS — BP 102/70 | HR 77 | Temp 97.5°F | Resp 17 | Ht 69.0 in | Wt 189.8 lb

## 2021-04-03 DIAGNOSIS — Z0289 Encounter for other administrative examinations: Secondary | ICD-10-CM

## 2021-04-03 LAB — CBC WITH DIFFERENTIAL/PLATELET
Basophils Absolute: 0 10*3/uL (ref 0.0–0.2)
Basos: 0 %
EOS (ABSOLUTE): 0.2 10*3/uL (ref 0.0–0.4)
Eos: 3 %
Hematocrit: 43.6 % (ref 37.5–51.0)
Hemoglobin: 14.8 g/dL (ref 13.0–17.7)
Immature Grans (Abs): 0 10*3/uL (ref 0.0–0.1)
Immature Granulocytes: 0 %
Lymphocytes Absolute: 2.3 10*3/uL (ref 0.7–3.1)
Lymphs: 39 %
MCH: 31.4 pg (ref 26.6–33.0)
MCHC: 33.9 g/dL (ref 31.5–35.7)
MCV: 92 fL (ref 79–97)
Monocytes Absolute: 0.4 10*3/uL (ref 0.1–0.9)
Monocytes: 7 %
Neutrophils Absolute: 2.9 10*3/uL (ref 1.4–7.0)
Neutrophils: 51 %
Platelets: 227 10*3/uL (ref 150–450)
RBC: 4.72 x10E6/uL (ref 4.14–5.80)
RDW: 12.6 % (ref 11.6–15.4)
WBC: 5.8 10*3/uL (ref 3.4–10.8)

## 2021-04-03 LAB — COMPREHENSIVE METABOLIC PANEL
ALT: 26 IU/L (ref 0–44)
AST: 20 IU/L (ref 0–40)
Albumin/Globulin Ratio: 1.8 (ref 1.2–2.2)
Albumin: 4.2 g/dL (ref 4.0–5.0)
Alkaline Phosphatase: 75 IU/L (ref 44–121)
BUN/Creatinine Ratio: 22 — ABNORMAL HIGH (ref 9–20)
BUN: 21 mg/dL (ref 6–24)
Bilirubin Total: 0.3 mg/dL (ref 0.0–1.2)
CO2: 20 mmol/L (ref 20–29)
Calcium: 9.1 mg/dL (ref 8.7–10.2)
Chloride: 105 mmol/L (ref 96–106)
Creatinine, Ser: 0.95 mg/dL (ref 0.76–1.27)
Globulin, Total: 2.4 g/dL (ref 1.5–4.5)
Glucose: 84 mg/dL (ref 65–99)
Potassium: 4.3 mmol/L (ref 3.5–5.2)
Sodium: 143 mmol/L (ref 134–144)
Total Protein: 6.6 g/dL (ref 6.0–8.5)
eGFR: 101 mL/min/{1.73_m2} (ref >59)

## 2021-04-03 NOTE — Progress Notes (Signed)
Subjective:  Patient ID: Edward Stewart, male    DOB: 11-23-1976  Age: 45 y.o. MRN: [id number]  CC: Employment physical   HPI Encounter for general adult medical examination without abnormal findings  Physical ("At Risk" items are starred): Patient's last physical exam was 1 year ago .  Have you had or do you now have: 1. Chronic cough with phlegm each morning? NO 2. Chronic cough with phlegm during the day? NO 3. Lung disease? NO 4. Asthma? NO 5. Chronic bronchitis? NO 6. Emphysema? NO 7. Chest injuries or chest surgeries? NO 8. Heart disease? NO 9. Allergies? NO 10. Defective vision? NO 11. Defective hearing? NO 12. Back injury or surgery? NO 13. Hypertension? NO 14. Diabetes? NO 15. Do you take any medications daily? NO  Growth percentile SmartLinks can only be used for patients less than 59 years old.   SDOH Screenings   Alcohol Screen: Not on file  Depression (RDE0-8): Not on file  Financial Resource Strain: Not on file  Food Insecurity: Not on file  Housing: Not on file  Physical Activity: Not on file  Social Connections: Not on file  Stress: Not on file  Tobacco Use: Unknown   Smoking Tobacco Use: Never Smoker   Smokeless Tobacco Use: Unknown  Transportation Needs: Not on file    Functional Status Survey:   Safety: reviewed ;  Patient wears a seat belt. Patient's home has smoke detectors and carbon monoxide detectors. Patient practices appropriate gun safety Patient wears sunscreen with extended sun exposure. Dental Care: biannual cleanings, brushes and flosses daily. Ophthalmology/Optometry: Annual visit.  Hearing loss: none Vision impairments: none Patient is not afflicted from Stress Incontinence and Urge Incontinence   Current Outpatient Medications on File Prior to Visit  Medication Sig Dispense Refill   oxyCODONE-acetaminophen (PERCOCET) 5-325 MG tablet 1-2 tabs po q6 hours prn pain 30 tablet 0   sulfamethoxazole-trimethoprim (BACTRIM DS)  800-160 MG tablet Take 1 tablet by mouth 2 (two) times daily. 14 tablet 0   No current facility-administered medications on file prior to visit.    Social Hx   Social History   Socioeconomic History   Marital status: Unknown    Spouse name: Not on file   Number of children: Not on file   Years of education: Not on file   Highest education level: Not on file  Occupational History   Not on file  Tobacco Use   Smoking status: Never Smoker   Smokeless tobacco: Not on file  Substance and Sexual Activity   Alcohol use: No   Drug use: No   Sexual activity: Not on file  Other Topics Concern   Not on file  Social History Narrative   Not on file   Social Determinants of Health   Financial Resource Strain: Not on file  Food Insecurity: Not on file  Transportation Needs: Not on file  Physical Activity: Not on file  Stress: Not on file  Social Connections: Not on file   No past medical history on file. No family history on file.  Review of Systems  Constitutional: Positive for fatigue and unexpected weight change. Negative for appetite change.  HENT: Negative for congestion, ear pain, hearing loss, rhinorrhea, sinus pressure, sinus pain, tinnitus and trouble swallowing.   Eyes: Negative for pain and visual disturbance.  Respiratory: Negative for cough and shortness of breath.   Cardiovascular: Negative for chest pain, palpitations and leg swelling.  Gastrointestinal: Negative for abdominal pain, constipation, diarrhea, nausea and vomiting.  Endocrine:  Negative for cold intolerance, heat intolerance, polydipsia, polyphagia and polyuria.  Genitourinary: Negative for dysuria, frequency and hematuria.  Musculoskeletal: Negative for arthralgias, back pain, joint swelling and myalgias.  Skin: Negative for rash.  Allergic/Immunologic: Positive for environmental allergies.  Neurological: Negative for dizziness, seizures, syncope and headaches.  Hematological: Negative for adenopathy.   Psychiatric/Behavioral: Negative for decreased concentration and sleep disturbance. The patient is not nervous/anxious.      Objective:   1. Encounter for physical examination related to employment - CBC with Differential/Platelet - Comprehensive metabolic panel  BP 102/70 (BP Location: Left Arm, Patient Position: Sitting, Cuff Size: Normal)   Pulse 77   Temp (!) 97.5 F (36.4 C)   Resp 17   Ht 5\' 9"  (1.753 m)   Wt 189 lb 12.8 oz (86.1 kg)   SpO2 99%   BMI 28.03 kg/m   BP/Weight 03/23/2016  Systolic BP 134  Diastolic BP 90    Physical Exam Vitals reviewed.  Constitutional:      Appearance: Normal appearance.  HENT:     Head: Normocephalic.     Right Ear: Tympanic membrane normal.     Left Ear: Tympanic membrane normal.     Nose: Nose normal.     Mouth/Throat:     Mouth: Mucous membranes are moist.  Eyes:     Pupils: Pupils are equal, round, and reactive to light.  Cardiovascular:     Rate and Rhythm: Normal rate and regular rhythm.     Pulses: Normal pulses.     Heart sounds: Normal heart sounds.  Pulmonary:     Effort: Pulmonary effort is normal.     Breath sounds: Normal breath sounds.  Abdominal:     General: Bowel sounds are normal.     Palpations: Abdomen is soft.  Musculoskeletal:        General: Normal range of motion.     Cervical back: Neck supple.  Skin:    General: Skin is warm and dry.     Capillary Refill: Capillary refill takes less than 2 seconds.  Neurological:     General: No focal deficit present.     Mental Status: He is alert and oriented to person, place, and time.  Psychiatric:        Mood and Affect: Mood normal.        Behavior: Behavior normal.     Lab Results  Component Value Date   WBC 14.0 (H) 03/22/2016   HGB 13.9 03/22/2016   HCT 41.5 03/22/2016   PLT 236 03/22/2016   GLUCOSE 105 (H) 03/22/2016   NA 140 03/22/2016   K 3.8 03/22/2016   CL 108 03/22/2016   CREATININE 1.00 03/22/2016   BUN 10 03/22/2016   CO2 23  03/22/2016      Assessment & Plan:   1. Encounter for physical examination related to employment - CBC with Differential/Platelet - Comprehensive metabolic panel    These are the goals we discussed: Goals   None      This is a list of the screening recommended for you and due dates:  Health Maintenance  Topic Date Due   HIV Screening  Never done   Hepatitis C Screening: USPSTF Recommendation to screen - Ages 9-79 yo.  Never done   Flu Shot  06/29/2021   Tetanus Vaccine  03/22/2026   HPV Vaccine  Aged Out    I, 03/24/2026, NP, have reviewed all documentation for this visit. The documentation on 04/03/21 for the exam,  diagnosis, procedures, and orders are all accurate and complete.  AN INDIVIDUALIZED CARE PLAN: was established or reinforced today.   SELF MANAGEMENT: The patient and I together assessed ways to personally work towards obtaining the recommended goals  Support needs The patient and/or family needs were assessed and services were offered and not necessary at this time.    Follow-up: PRN  Janie Morning [facility] [phone]
